# Patient Record
Sex: Female | Born: 2000 | Race: White | Hispanic: No | Marital: Single | Smoking: Never smoker
Health system: Southern US, Community
[De-identification: ages and names within clinical notes are randomized; demographics above are authoritative.]

---
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
## 2022-01-31 ENCOUNTER — Encounter: Payer: Self-pay | Admitting: Oncology

## 2022-01-31 ENCOUNTER — Ambulatory Visit (INDEPENDENT_AMBULATORY_CARE_PROVIDER_SITE_OTHER): Payer: 59 | Admitting: Oncology

## 2022-01-31 VITALS — BP 110/78 | Temp 98.9°F | Resp 18 | Ht 67.0 in | Wt 142.0 lb

## 2022-01-31 DIAGNOSIS — R1013 Epigastric pain: Secondary | ICD-10-CM

## 2022-01-31 MED ORDER — PANTOPRAZOLE SODIUM 20 MG PO TBEC: 20.0000 mg | DELAYED_RELEASE_TABLET | Freq: Every day | ORAL | 0 refills | Status: DC

## 2022-01-31 NOTE — Progress Notes (Signed)
[facility] [address] [place], [place] [address] Phone: [phone] Fax: [phone]   Office Visit Note  Patient Name: Kayla Good  Date of NOBSJ:[id number]  Med Rec number [id number]  Date of Service: 01/31/2022  Patient has no known allergies.  No chief complaint on file.  Here for epigastric abdominal pain. Symptoms are intermittent and started about 2 weeks ago. Occasionally has to stop what she is doing d/t pain. Had gall bladder removed in May for gall stones and RUQ pain.Gall bladder sx have fully resolved.  Having bowel movements daily and sometimes several/day mixed soft and liquid in consistency. Worse in the morning, midday and after eating meals. Diet is unchanged and not different from home.  No known food allergies although she does limit dairy d/t bloating.  Occasional nausea but no vomiting. Has not tried anything otc. Not currently having pain.   Current Medication:  Outpatient Encounter Medications as of 01/31/2022  Medication Sig   pantoprazole (PROTONIX) 20 MG tablet Take 1 tablet (20 mg total) by mouth daily.   NEXTSTELLIS 3-14.2 MG TABS Take 1 tablet by mouth daily.   No facility-administered encounter medications on file as of 01/31/2022.      Medical History: History reviewed. No pertinent past medical history.   Vital Signs: BP 110/78   Temp 98.9 F (37.2 C) (Tympanic)   Resp 18   Ht 5\' 7"  (1.702 m)   Wt 142 lb (64.4 kg)   SpO2 99%   BMI 22.24 kg/m    Review of Systems  Gastrointestinal:  Positive for abdominal pain, diarrhea and nausea.    Physical Exam Constitutional:      Appearance: Normal appearance.  Abdominal:     General: Bowel sounds are increased.     Palpations: Abdomen is soft. There is no hepatomegaly or splenomegaly.     Tenderness: There is abdominal tenderness in the epigastric area and periumbilical area.     No results found for this or any previous visit (from the past 24  hour(s)).  Assessment/Plan: 1. Epigastric abdominal pain- -Unclear etiology of symptoms.  -Reproducible tenderness on exam. -Discussed several different diagnosis including IBS, GERD or food intolerance.  -Discussed trial of an acid reducer such an Protonix 20 mg daily X 2 weeks to see if symptoms improved.  - If no improvement could discuss referral to Gastroenterology given history.   Disposition- Please let me know if your sx worsen. RTC in 2 weeks for follow-up.    General Counseling: Salote verbalizes understanding of the findings of todays visit and agrees with plan of treatment. I have discussed any further diagnostic evaluation that may be needed or ordered today. We also reviewed her medications today. she has been encouraged to call the office with any questions or concerns that should arise related to todays visit.   No orders of the defined types were placed in this encounter.    I spent 25 minutes dedicated to the care of this patient (face-to-face and non-face-to-face) on the date of the encounter to include what is described in the assessment and plan.   , NP 01/31/2022 12:47 PM

## 2022-02-26 ENCOUNTER — Other Ambulatory Visit: Payer: Self-pay | Admitting: Oncology

## 2022-02-26 DIAGNOSIS — R1013 Epigastric pain: Secondary | ICD-10-CM

## 2022-03-13 ENCOUNTER — Other Ambulatory Visit: Payer: Self-pay | Admitting: Oncology

## 2022-03-13 DIAGNOSIS — R1013 Epigastric pain: Secondary | ICD-10-CM

## 2022-04-17 ENCOUNTER — Ambulatory Visit (INDEPENDENT_AMBULATORY_CARE_PROVIDER_SITE_OTHER): Payer: 59 | Admitting: Oncology

## 2022-04-17 ENCOUNTER — Encounter: Payer: Self-pay | Admitting: Oncology

## 2022-04-17 VITALS — BP 118/62 | HR 102 | Temp 97.2°F | Resp 18 | Ht 67.0 in | Wt 142.0 lb

## 2022-04-17 DIAGNOSIS — R5383 Other fatigue: Secondary | ICD-10-CM

## 2022-04-17 DIAGNOSIS — R1084 Generalized abdominal pain: Secondary | ICD-10-CM | POA: Diagnosis not present

## 2022-04-17 DIAGNOSIS — R110 Nausea: Secondary | ICD-10-CM | POA: Diagnosis not present

## 2022-04-17 MED ORDER — ONDANSETRON HCL 4 MG PO TABS: 4.0000 mg | ORAL_TABLET | Freq: Three times a day (TID) | ORAL | 0 refills | Status: DC | PRN

## 2022-04-17 NOTE — Progress Notes (Signed)
[facility] [address] [place], [place] [address] Phone: [phone] Fax: [phone]   Office Visit Note  Patient Name: Kayla Good  Date of ACZYS:[id number]  Med Rec number [id number]  Date of Service: 04/17/2022  Patient has no known allergies.  Chief Complaint  Patient presents with   Fatigue   Patient is an 21 y.o. student here for complaints of fatigue,nausea, sleeping alot and low appetite X 1 month. Has never had similar sx in the past. Occasional abdominal pain. No hx of acid reflux in the past.   Has heavy menstural cycles. BC has improved her symptoms some. Cycles last about 4-5 days.   Has never had mono. Knows a friend that was recently diagnosed.   Takes protonix when she needs it. Not daily. Has a dairy/lactose allergy.   Current Medication:  Outpatient Encounter Medications as of 04/17/2022  Medication Sig   NEXTSTELLIS 3-14.2 MG TABS Take 1 tablet by mouth daily.   pantoprazole (PROTONIX) 20 MG tablet TAKE 1 TABLET BY MOUTH EVERY DAY   No facility-administered encounter medications on file as of 04/17/2022.      Medical History: History reviewed. No pertinent past medical history.  Vital Signs: BP 118/62   Pulse (!) 102   Temp (!) 97.2 F (36.2 C) (Tympanic)   Resp 18   Ht 5\' 7"  (1.702 m)   Wt 142 lb (64.4 kg)   SpO2 99%   BMI 22.24 kg/m   ROS: As per HPI.  All other pertinent ROS negative.     Review of Systems  Constitutional:  Positive for appetite change and fatigue.  Gastrointestinal:  Positive for abdominal pain, diarrhea and nausea.  Musculoskeletal:  Positive for back pain (Left upper back pain occasional). Negative for myalgias.  Neurological:  Positive for headaches.    Physical Exam Constitutional:      Appearance: Normal appearance.  HENT:     Right Ear: Tympanic membrane normal.     Left Ear: Tympanic membrane normal.     Nose: No congestion or rhinorrhea.     Mouth/Throat:     Pharynx: No pharyngeal  swelling, oropharyngeal exudate or posterior oropharyngeal erythema.  Cardiovascular:     Rate and Rhythm: Normal rate.  Abdominal:     General: Bowel sounds are normal.     Palpations: Abdomen is soft. There is no hepatomegaly or splenomegaly.     Tenderness: There is no abdominal tenderness.     Comments: No reproducible abdominal pain.   Lymphadenopathy:     Cervical: No cervical adenopathy.  Neurological:     Mental Status: She is alert.     No results found for this or any previous visit (from the past 24 hour(s)).  Assessment/Plan: 1. Other fatigue -Labs today to rule out abnormalities such as anemia, thyroid deficiency, mono or vitamin D deficiency.  Discussed that results will be sent via MyChart when they return.  - CBC w/Diff - TSH - Vitamin D (25 hydroxy) - B12 - Ferritin - EBV ab to viral capsid ag pnl, IgG+IgM  2. Nausea -Can try Zofran 4 mg tablets every 6-8 hours as needed for nausea.  Recommend taking 15 minutes prior to eating food.  Provided prescription while in clinic today.  Discussed consistently taking Protonix 20 mg each day for GERD symptoms.  Protonix is not typically used for episodes of acid reflux rather management.  - ondansetron (ZOFRAN) 4 MG tablet; Take 1 tablet (4 mg total) by mouth every 8 (eight) hours  as needed for nausea or vomiting.  Dispense: 5 tablet; Refill: 0  3. Generalized abdominal pain -No reproducible pain.  Described as generalized.  Given close encounter with a friend who tested positive for mono would recommend EBV labs today to rule out mono.  Also having significant fatigue.  - EBV ab to viral capsid ag pnl, IgG+IgM   Disposition-RTC for worsening symptoms.  We will send results when available.  General Counseling: Kayla Good verbalizes understanding of the findings of todays visit and agrees with plan of treatment. I have discussed any further diagnostic evaluation that may be needed or ordered today. We also reviewed her  medications today. she has been encouraged to call the office with any questions or concerns that should arise related to todays visit.   No orders of the defined types were placed in this encounter.   No orders of the defined types were placed in this encounter.  I spent 20 minutes dedicated to the care of this patient (face-to-face and non-face-to-face) on the date of the encounter to include what is described in the assessment and plan.   Durenda Hurt, NP 04/17/2022 10:29 AM

## 2022-04-18 LAB — CBC WITH DIFFERENTIAL/PLATELET
Basophils Absolute: 0.1 10*3/uL (ref 0.0–0.2)
Basos: 1 %
EOS (ABSOLUTE): 0.2 10*3/uL (ref 0.0–0.4)
Eos: 2 %
Hematocrit: 38.1 % (ref 34.0–46.6)
Hemoglobin: 12.5 g/dL (ref 11.1–15.9)
Immature Grans (Abs): 0 10*3/uL (ref 0.0–0.1)
Immature Granulocytes: 0 %
Lymphocytes Absolute: 2.2 10*3/uL (ref 0.7–3.1)
Lymphs: 30 %
MCH: 28 pg (ref 26.6–33.0)
MCHC: 32.8 g/dL (ref 31.5–35.7)
MCV: 85 fL (ref 79–97)
Monocytes Absolute: 0.6 10*3/uL (ref 0.1–0.9)
Monocytes: 8 %
Neutrophils Absolute: 4.3 10*3/uL (ref 1.4–7.0)
Neutrophils: 59 %
Platelets: 300 10*3/uL (ref 150–450)
RBC: 4.47 x10E6/uL (ref 3.77–5.28)
RDW: 12.2 % (ref 11.7–15.4)
WBC: 7.3 10*3/uL (ref 3.4–10.8)

## 2022-04-18 LAB — EBV AB TO VIRAL CAPSID AG PNL, IGG+IGM
EBV VCA IgG: >600 U/mL — ABNORMAL HIGH (ref 0.0–17.9)
EBV VCA IgM: <36 U/mL (ref 0.0–35.9)

## 2022-04-18 LAB — FERRITIN: Ferritin: 14 ng/mL — ABNORMAL LOW (ref 15–77)

## 2022-04-18 LAB — VITAMIN B12: Vitamin B-12: 504 pg/mL (ref 232–1245)

## 2022-04-18 LAB — VITAMIN D 25 HYDROXY (VIT D DEFICIENCY, FRACTURES): Vit D, 25-Hydroxy: 35.5 ng/mL (ref 30.0–100.0)

## 2022-04-18 LAB — TSH: TSH: 1.73 u[IU]/mL (ref 0.450–4.500)

## 2022-04-19 ENCOUNTER — Encounter: Payer: Self-pay | Admitting: Family Medicine

## 2022-08-14 ENCOUNTER — Ambulatory Visit: Payer: 59 | Admitting: Oncology

## 2023-03-04 ENCOUNTER — Encounter: Payer: Self-pay | Admitting: Oncology

## 2023-03-04 ENCOUNTER — Ambulatory Visit (INDEPENDENT_AMBULATORY_CARE_PROVIDER_SITE_OTHER): Payer: 59 | Admitting: Oncology

## 2023-03-04 VITALS — BP 106/62 | HR 78 | Temp 98.8°F | Ht 66.0 in | Wt 148.0 lb

## 2023-03-04 DIAGNOSIS — G4700 Insomnia, unspecified: Secondary | ICD-10-CM | POA: Diagnosis not present

## 2023-03-04 DIAGNOSIS — R5383 Other fatigue: Secondary | ICD-10-CM

## 2023-03-04 MED ORDER — HYDROXYZINE HCL 10 MG PO TABS
10.0000 mg | ORAL_TABLET | Freq: Three times a day (TID) | ORAL | 0 refills | Status: DC | PRN
Start: 2023-03-04 — End: 2024-02-04

## 2023-03-04 NOTE — Progress Notes (Signed)
[facility] [address] [place], [place] [address] Phone: [phone] Fax: [phone]   Office Visit Note  Patient Name: Kayla Good  Date of ZHYQM:[id number]  Med Rec number [id number]  Date of Service: 03/04/2023  Patient has no known allergies.  Chief Complaint  Patient presents with   Acute Visit   HPI Patient is an 22 y.o. student here for complaints of fatigue, trouble sleeping (waking every hour), HA and nausea over the past few weeks.  Reports the first week of classes she was able to sleep fine but ever since she has had issues.  Feels like her appetite is also low. Has tried melatonin, Night cap gummies which have helped her fall asleep but not stay asleep.  Denies feeling overly anxious.  Reports classes are busy but not overwhelming.  Feels like she exerts enough energy throughout the day.  Does not drink excessive amounts of caffeine.  She is not taking any medications that are stimulating.  Current Medication:  Outpatient Encounter Medications as of 03/04/2023  Medication Sig   NEXTSTELLIS 3-14.2 MG TABS Take 1 tablet by mouth daily.   ondansetron (ZOFRAN) 4 MG tablet Take 1 tablet (4 mg total) by mouth every 8 (eight) hours as needed for nausea or vomiting. (Patient not taking: Reported on 03/04/2023)   pantoprazole (PROTONIX) 20 MG tablet TAKE 1 TABLET BY MOUTH EVERY DAY (Patient not taking: Reported on 03/04/2023)   No facility-administered encounter medications on file as of 03/04/2023.   Medical History: History reviewed. No pertinent past medical history.  Vital Signs: BP 106/62   Pulse 78   Temp 98.8 F (37.1 C) (Tympanic)   Ht 5\' 6"  (1.676 m)   Wt 148 lb (67.1 kg)   SpO2 99%   BMI 23.89 kg/m   ROS: As per HPI.  All other pertinent ROS negative.     Review of Systems  Constitutional:  Positive for appetite change and fatigue.  Gastrointestinal:  Positive for nausea.  Psychiatric/Behavioral:  Positive for sleep disturbance.      Physical Exam Vitals reviewed.  Constitutional:      General: She is not in acute distress.    Appearance: Normal appearance.  Neurological:     Mental Status: She is alert.     No results found for this or any previous visit (from the past 24 hour(s)).  Assessment/Plan: 1. Insomnia, unspecified type Has tried melatonin and other over-the-counter sleep aids without significant improvement. Denies any over-the-counter or prescribed stimulating agents. Denies excessive caffeine use. We discussed trying hydroxyzine 10 mg tablets.  Take 1 by mouth at night about 30-45 minutes before bed.  May also take her melatonin 10 mg.  We discussed slowly increasing over the next few nights to see if this helps her insomnia. Plan is for her to reach out based on how she tolerates.  - hydrOXYzine (ATARAX) 10 MG tablet; Take 1 tablet (10 mg total) by mouth 3 (three) times daily as needed.  Dispense: 60 tablet; Refill: 0  2. Fatigue, unspecified type Recommend lab work to rule out blood abnormality.  Previously she was told she had low iron levels.  Will recheck plus others.  Discussed we will send results via MyChart when available.  - CBC w/Diff - Comp Met (CMET) - TSH - Vitamin D (25 hydroxy) - B12 - Ferritin - Iron, TIBC and Ferritin Panel   General Counseling: [name] verbalizes understanding of the findings of todays visit and agrees with plan of treatment. I have  discussed any further diagnostic evaluation that may be needed or ordered today. We also reviewed her medications today. she has been encouraged to call the office with any questions or concerns that should arise related to todays visit.   No orders of the defined types were placed in this encounter.   No orders of the defined types were placed in this encounter.   I spent 20 minutes dedicated to the care of this patient (face-to-face and non-face-to-face) on the date of the encounter to include what is described in the  assessment and plan.   Durenda Hurt, NP 03/04/2023 4:47 PM

## 2023-03-06 ENCOUNTER — Encounter: Payer: Self-pay | Admitting: Oncology

## 2023-03-06 LAB — CBC WITH DIFFERENTIAL/PLATELET
Basophils Absolute: 0.1 10*3/uL (ref 0.0–0.2)
Basos: 1 %
EOS (ABSOLUTE): 0.1 10*3/uL (ref 0.0–0.4)
Eos: 2 %
Hematocrit: 40.9 % (ref 34.0–46.6)
Hemoglobin: 13.1 g/dL (ref 11.1–15.9)
Immature Grans (Abs): 0 10*3/uL (ref 0.0–0.1)
Immature Granulocytes: 0 %
Lymphocytes Absolute: 2.2 10*3/uL (ref 0.7–3.1)
Lymphs: 27 %
MCH: 28.1 pg (ref 26.6–33.0)
MCHC: 32 g/dL (ref 31.5–35.7)
MCV: 88 fL (ref 79–97)
Monocytes Absolute: 0.7 10*3/uL (ref 0.1–0.9)
Monocytes: 8 %
Neutrophils Absolute: 5 10*3/uL (ref 1.4–7.0)
Neutrophils: 62 %
Platelets: 309 10*3/uL (ref 150–450)
RBC: 4.67 x10E6/uL (ref 3.77–5.28)
RDW: 12.1 % (ref 11.7–15.4)
WBC: 8.1 10*3/uL (ref 3.4–10.8)

## 2023-03-06 LAB — COMPREHENSIVE METABOLIC PANEL
ALT: 16 [IU]/L (ref 0–32)
AST: 20 [IU]/L (ref 0–40)
Albumin: 4.8 g/dL (ref 4.0–5.0)
Alkaline Phosphatase: 121 [IU]/L — ABNORMAL HIGH (ref 42–106)
BUN/Creatinine Ratio: 14 (ref 9–23)
BUN: 10 mg/dL (ref 6–20)
Bilirubin Total: 0.4 mg/dL (ref 0.0–1.2)
CO2: 20 mmol/L (ref 20–29)
Calcium: 9.7 mg/dL (ref 8.7–10.2)
Chloride: 103 mmol/L (ref 96–106)
Creatinine, Ser: 0.69 mg/dL (ref 0.57–1.00)
Globulin, Total: 2.7 g/dL (ref 1.5–4.5)
Glucose: 91 mg/dL (ref 70–99)
Potassium: 4.7 mmol/L (ref 3.5–5.2)
Sodium: 139 mmol/L (ref 134–144)
Total Protein: 7.5 g/dL (ref 6.0–8.5)
eGFR: 127 mL/min/{1.73_m2} (ref >59)

## 2023-03-06 LAB — TSH: TSH: 1.45 u[IU]/mL (ref 0.450–4.500)

## 2023-03-06 LAB — FERRITIN: Ferritin: 21 ng/mL (ref 15–150)

## 2023-03-06 LAB — VITAMIN D 25 HYDROXY (VIT D DEFICIENCY, FRACTURES): Vit D, 25-Hydroxy: 54 ng/mL (ref 30.0–100.0)

## 2023-03-06 LAB — VITAMIN B12: Vitamin B-12: 468 pg/mL (ref 232–1245)

## 2023-08-02 IMAGING — US US ABDOMEN LIMITED
1 series · 14 of 25 positions shown · non-contrast
Comparison: None.

CLINICAL DATA: Right upper quadrant pain.  History of gallstones.

EXAM:
ULTRASOUND ABDOMEN LIMITED RIGHT UPPER QUADRANT

[Series 1: us abdomen limited ruq (liver/gb) · 14 of 33 slices shown]
[im 1/33]
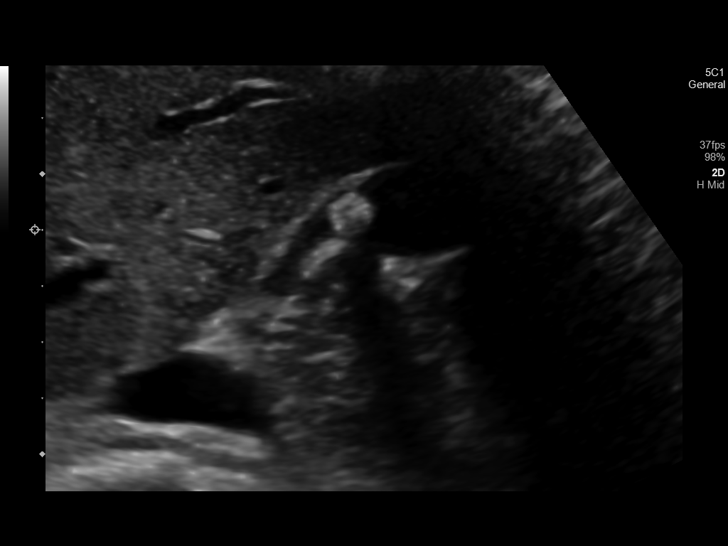
[im 3/33]
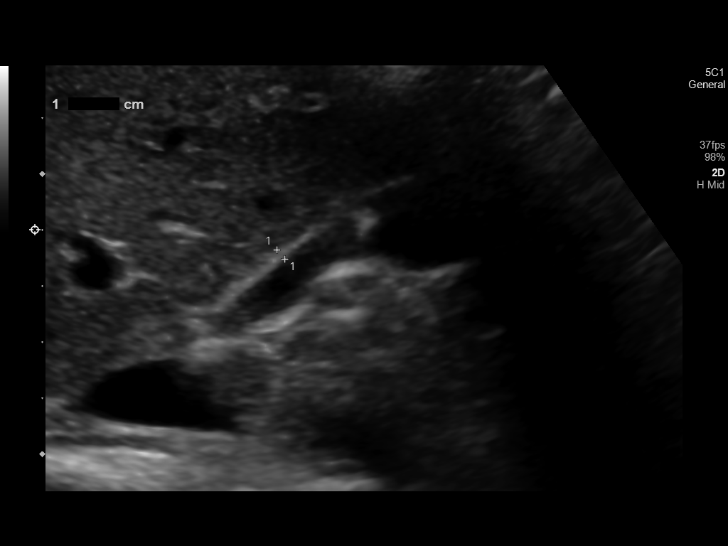
[im 6/33]
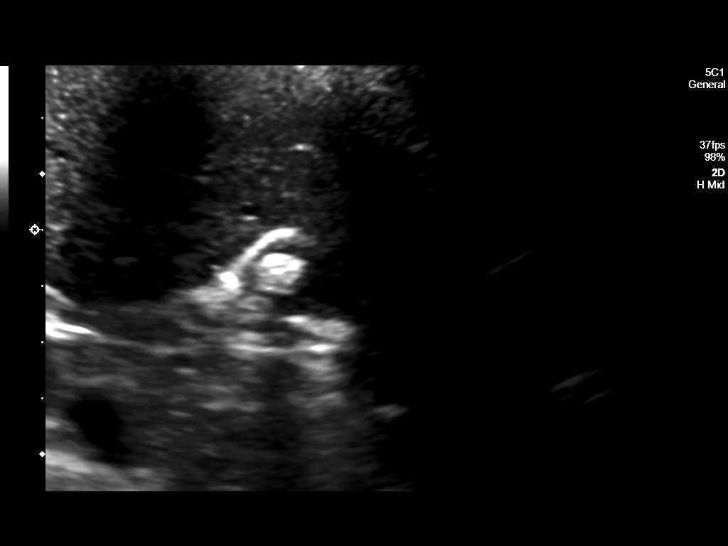
[im 9/33]
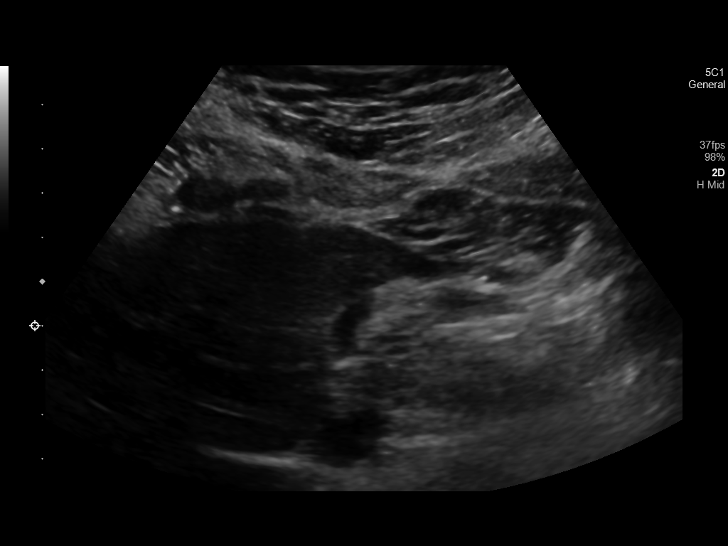
[im 11/33]
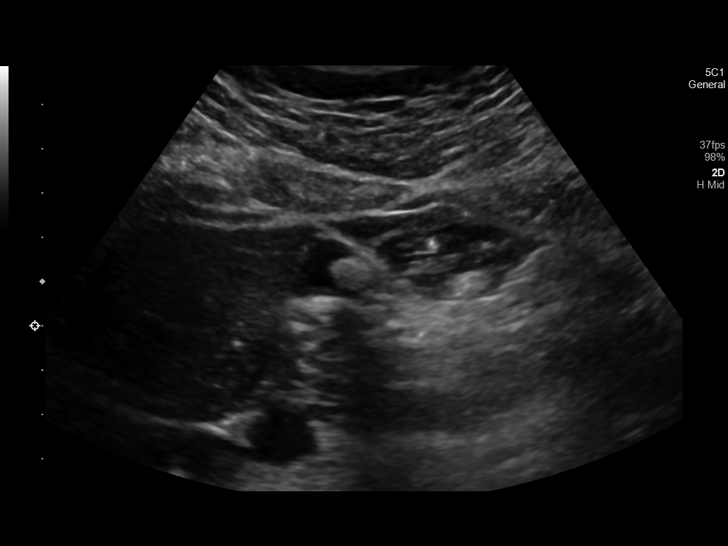
[im 13/33]
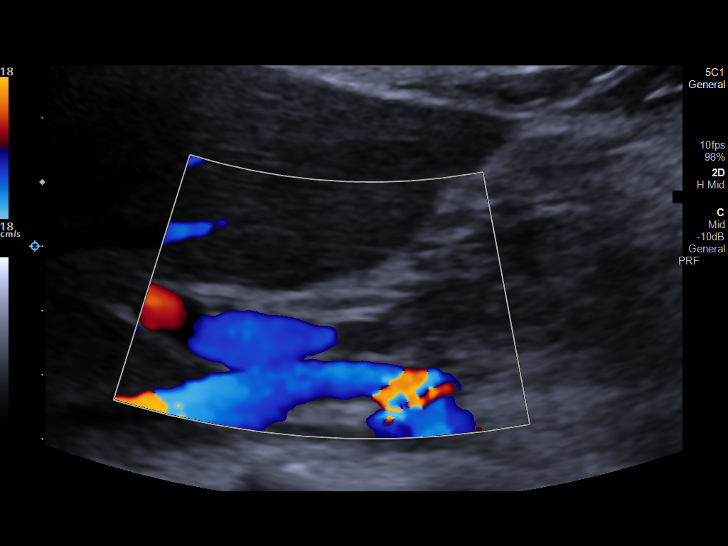
[im 15/33]
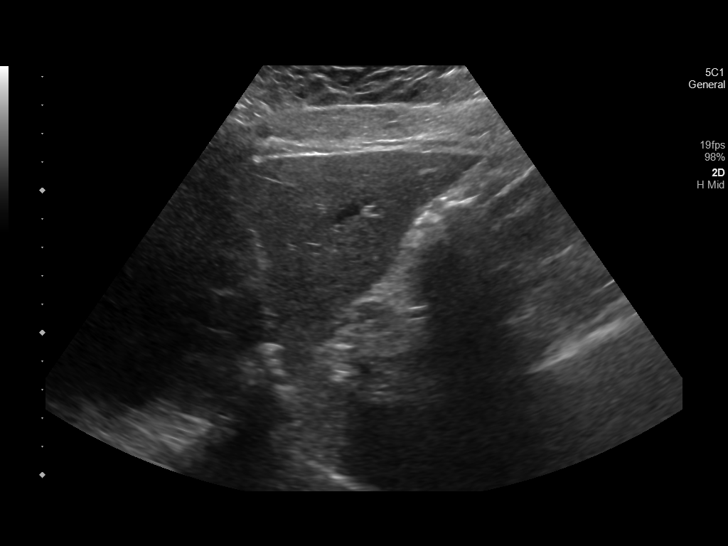
[im 18/33]
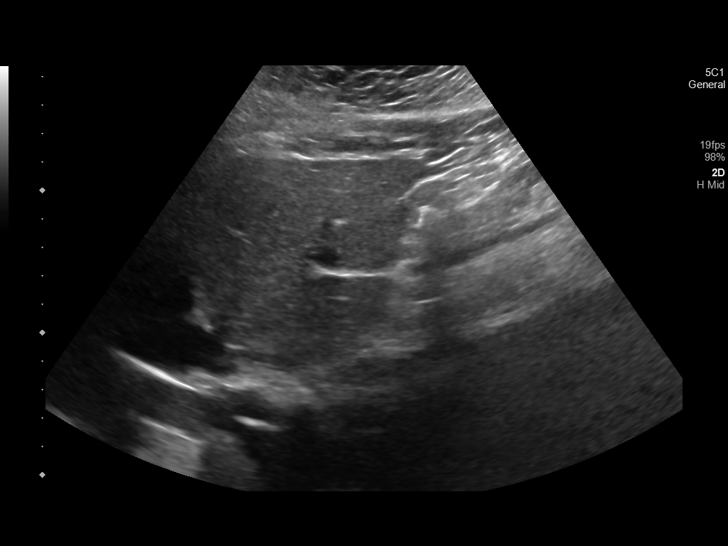
[im 21/33]
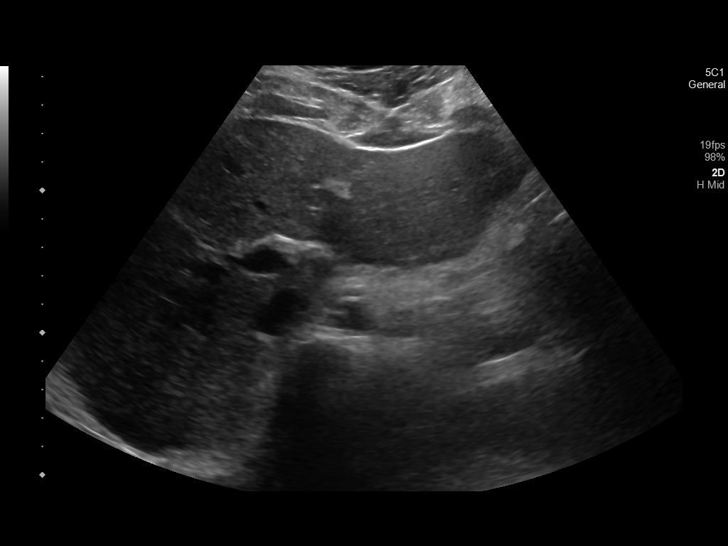
[im 22/33]
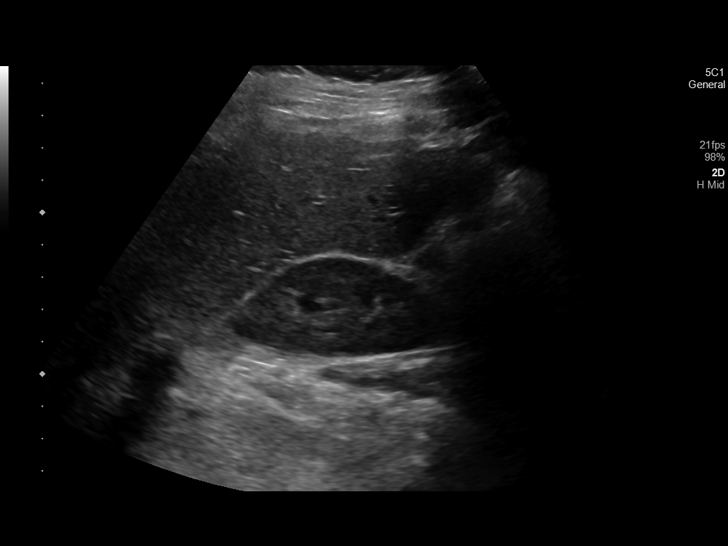
[im 25/33]
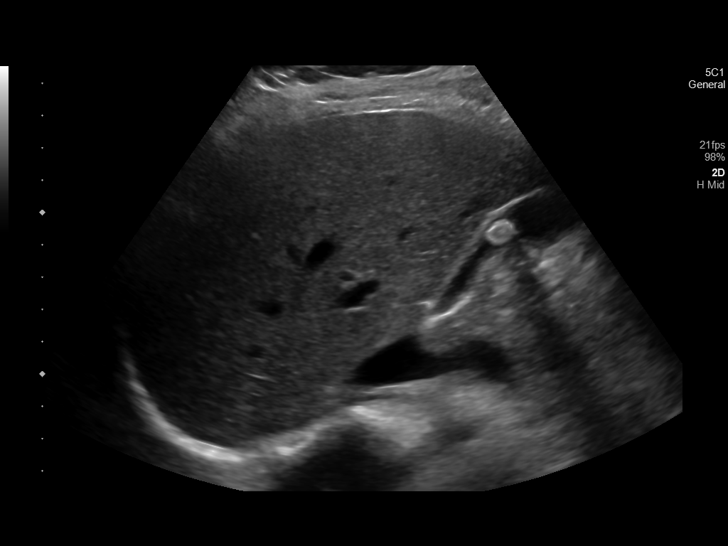
[im 27/33]
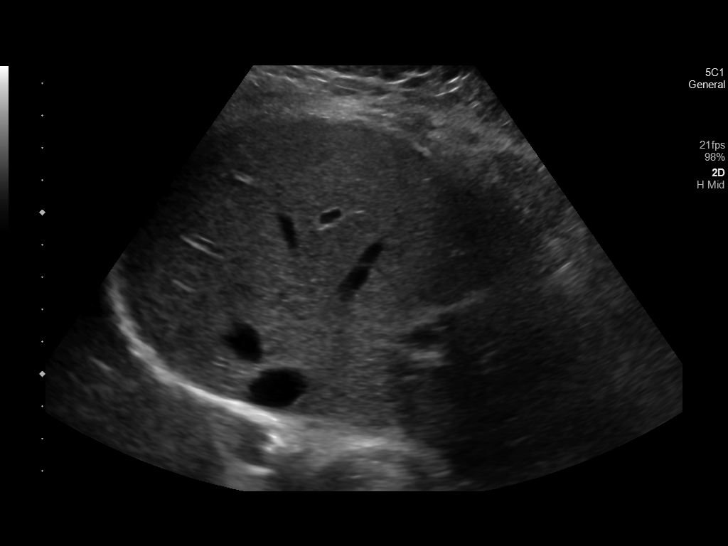
[im 30/33]
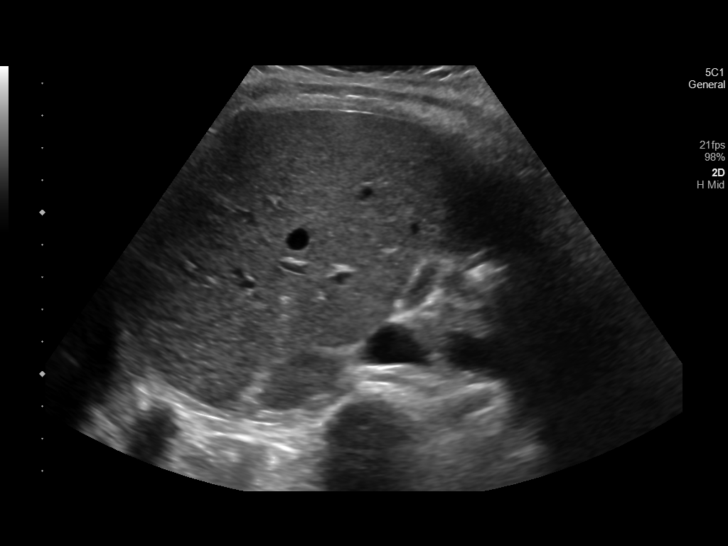
[im 33/33]
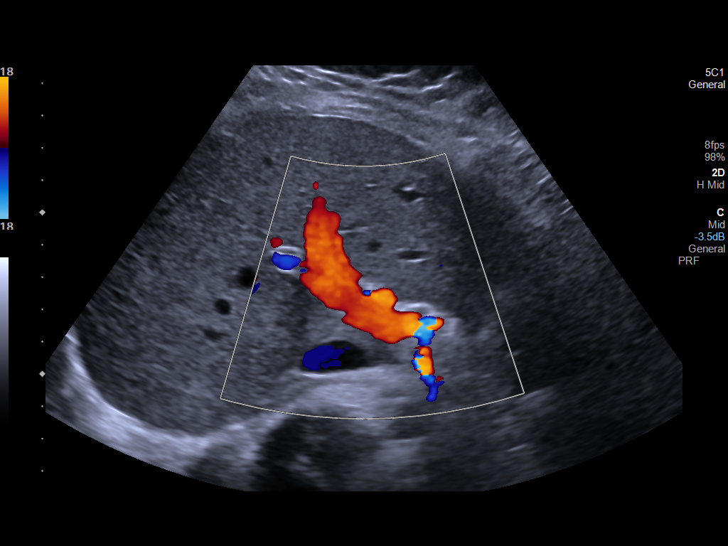

[14 of 25 positions shown; findings below may reference images not displayed]

FINDINGS: Gallbladder:

Multiple shadowing echogenic gallstones are seen. The largest
measures 9 mm. Normal gallbladder wall thickness of 2 mm.

Common bile duct:

Diameter: 2 mm

Liver:

No focal lesion identified. Within normal limits in parenchymal
echogenicity. Portal vein is patent on color Doppler imaging with
normal direction of blood flow towards the liver.

Other: None.
IMPRESSION: Cholelithiasis without sonographic evidence of acute cholecystitis.

## 2023-08-03 ENCOUNTER — Other Ambulatory Visit: Payer: Self-pay

## 2023-08-03 ENCOUNTER — Ambulatory Visit
Admission: EM | Admit: 2023-08-03 | Discharge: 2023-08-03 | Disposition: A | Discharge status: Home / Self Care | Attending: Emergency Medicine | Admitting: Emergency Medicine

## 2023-08-03 ENCOUNTER — Encounter: Payer: Self-pay | Admitting: Emergency Medicine

## 2023-08-03 DIAGNOSIS — B349 Viral infection, unspecified: Secondary | ICD-10-CM

## 2023-08-03 LAB — POC COVID19/FLU A&B COMBO
Covid Antigen, POC: NEGATIVE
Influenza A Antigen, POC: NEGATIVE
Influenza B Antigen, POC: NEGATIVE

## 2023-08-03 LAB — POCT MONO SCREEN (KUC): Mono, POC: NEGATIVE

## 2023-08-03 MED ORDER — PREDNISONE 10 MG (21) PO TBPK: ORAL_TABLET | Freq: Every day | ORAL | 0 refills | Status: DC

## 2023-08-03 MED ORDER — ONDANSETRON 4 MG PO TBDP: 4.0000 mg | ORAL_TABLET | Freq: Three times a day (TID) | ORAL | 0 refills | Status: DC | PRN

## 2023-08-03 NOTE — Discharge Instructions (Addendum)
 Your symptoms today are most likely being caused by a virus and should steadily improve in time it can take up to 7 to 10 days before you truly start to see a turnaround however things will get better  COVID, flu and monotest negative  Lymph nodes are swollen most likely due to nasal congestion and drainage as it attempts to help clear this out from the body, causes pain as it presses on the tissues around it  To help reduce lymph nodes and for general comfort begin prednisone every morning as directed, take with food, may use Tylenol in addition but avoid use of ibuprofen  May use Zofran every 8 hours as needed for nausea    You can take Tylenol and/or Ibuprofen as needed for fever reduction and pain relief.   For cough: honey 1/2 to 1 teaspoon (you can dilute the honey in water or another fluid).  You can also use guaifenesin and dextromethorphan for cough. You can use a humidifier for chest congestion and cough.  If you don't have a humidifier, you can sit in the bathroom with the hot shower running.      For sore throat: try warm salt water gargles, cepacol lozenges, throat spray, warm tea or water with lemon/honey, popsicles or ice, or OTC cold relief medicine for throat discomfort.   For congestion: take a daily anti-histamine like Zyrtec, Claritin, and a oral decongestant, such as pseudoephedrine.  You can also use Flonase 1-2 sprays in each nostril daily.   It is important to stay hydrated: drink plenty of fluids (water, gatorade/powerade/pedialyte, juices, or teas) to keep your throat moisturized and help further relieve irritation/discomfort.

## 2023-08-03 NOTE — ED Triage Notes (Signed)
 Patient presents to [facility] for evaluation of left lymph node swelling and tenderness x 2 days.  Patient states on and off nsal congestion recently.  Denies ear pain, denies sore throat, denies hx of same.

## 2023-08-03 NOTE — ED Provider Notes (Signed)
 [facility]    CSN: [id number] Arrival date & time: 08/03/23  1350      History   Chief Complaint Chief Complaint  Patient presents with   Lymphadenopathy    HPI Kayla Good is a 23 y.o. female.   Patient presents for evaluation of nasal congestion, postnasal drip, left-sided ear fullness, intermittent headaches, mild severe pain present for 2 days.  Has experienced nausea without vomiting over the past 7 days.  Denies abdominal pain.  No known sick contacts.  Tolerating food and liquids.  Has not attempted treatment.  Denies fever.  History reviewed. No pertinent past medical history.  There are no active problems to display for this patient.   History reviewed. No pertinent surgical history.  OB History   No obstetric history on file.      Home Medications    Prior to Admission medications   Medication Sig Start Date End Date Taking? Authorizing Provider  ondansetron (ZOFRAN-ODT) 4 MG disintegrating tablet Take 1 tablet (4 mg total) by mouth every 8 (eight) hours as needed. 08/03/23  Yes Cleone Hulick R, NP  predniSONE (STERAPRED UNI-PAK 21 TAB) 10 MG (21) TBPK tablet Take by mouth daily. Take 6 tabs by mouth daily  for 1 days, then 5 tabs for 1 days, then 4 tabs for 1 days, then 3 tabs for 1 days, 2 tabs for 1 days, then 1 tab by mouth daily for 1 days 08/03/23  Yes Keydi Giel R, NP  hydrOXYzine (ATARAX) 10 MG tablet Take 1 tablet (10 mg total) by mouth 3 (three) times daily as needed. 03/04/23   Mauro Kaufmann, NP  NEXTSTELLIS 3-14.2 MG TABS Take 1 tablet by mouth daily. 12/28/21   [provider]  ondansetron (ZOFRAN) 4 MG tablet Take 1 tablet (4 mg total) by mouth every 8 (eight) hours as needed for nausea or vomiting. Patient not taking: Reported on 03/04/2023 04/17/22   Mauro Kaufmann, NP  pantoprazole (PROTONIX) 20 MG tablet TAKE 1 TABLET BY MOUTH EVERY DAY Patient not taking: Reported on 03/04/2023 03/13/22   Mauro Kaufmann, NP     Family History History reviewed. No pertinent family history.  Social History Social History   Tobacco Use   Smoking status: Never   Smokeless tobacco: Never  Vaping Use   Vaping status: Never Used  Substance Use Topics   Alcohol use: Yes   Drug use: Never     Allergies   Patient has no known allergies.   Review of Systems Review of Systems   Physical Exam Triage Vital Signs ED Triage Vitals [08/03/23 1411]  Encounter Vitals Group     BP 119/83     Systolic BP Percentile      Diastolic BP Percentile      Pulse Rate 87     Resp 16     Temp (!) 97.3 F (36.3 C)     Temp Source Temporal     SpO2 97 %     Weight      Height      Head Circumference      Peak Flow      Pain Score 2     Pain Loc      Pain Education      Exclude from Growth Chart    No data found.  Updated Vital Signs BP 119/83 (BP Location: Left Arm)   Pulse 87   Temp (!) 97.3 F (36.3 C) (Temporal)   Resp 16  LMP 07/16/2023 (Approximate)   SpO2 97%   Visual Acuity Right Eye Distance:   Left Eye Distance:   Bilateral Distance:    Right Eye Near:   Left Eye Near:    Bilateral Near:     Physical Exam Constitutional:      Appearance: Normal appearance.  HENT:     Right Ear: Tympanic membrane, ear canal and external ear normal.     Left Ear: Tympanic membrane, ear canal and external ear normal.     Nose: Congestion present. No rhinorrhea.     Mouth/Throat:     Pharynx: No oropharyngeal exudate.  Eyes:     Extraocular Movements: Extraocular movements intact.  Cardiovascular:     Pulses: Normal pulses.     Heart sounds: Normal heart sounds.  Pulmonary:     Effort: Pulmonary effort is normal.     Breath sounds: Normal breath sounds.  Lymphadenopathy:     Cervical: Cervical adenopathy present.  Neurological:     Mental Status: She is alert and oriented to person, place, and time. Mental status is at baseline.      UC Treatments / Results  Labs (all labs ordered are  listed, but only abnormal results are displayed) Labs Reviewed  POC COVID19/FLU A&B COMBO - Normal  POCT MONO SCREEN ([facility]) - Normal    EKG   Radiology No results found.  Procedures Procedures (including critical care time)  Medications Ordered in UC Medications - No data to display  Initial Impression / Assessment and Plan / UC Course  I have reviewed the triage vital signs and the nursing notes.  Pertinent labs & imaging results that were available during my care of the patient were reviewed by me and considered in my medical decision making (see chart for details).  Viral illness  Patient is in no signs of distress nor toxic appearing.  Vital signs are stable.  Low suspicion for pneumonia, pneumothorax or bronchitis and therefore will defer imaging.  COVID, flu and mono test negative.  Prescribed prednisone and Zofran.May use additional over-the-counter medications as needed for supportive care.  May follow-up with urgent care as needed if symptoms persist or worsen.  Final Clinical Impressions(s) / UC Diagnoses   Final diagnoses:  Viral illness     Discharge Instructions      Your symptoms today are most likely being caused by a virus and should steadily improve in time it can take up to 7 to 10 days before you truly start to see a turnaround however things will get better  COVID, flu and monotest negative  Lymph nodes are swollen most likely due to nasal congestion and drainage as it attempts to help clear this out from the body, causes pain as it presses on the tissues around it  To help reduce lymph nodes and for general comfort begin prednisone every morning as directed, take with food, may use Tylenol in addition but avoid use of ibuprofen  May use Zofran every 8 hours as needed for nausea    You can take Tylenol and/or Ibuprofen as needed for fever reduction and pain relief.   For cough: honey 1/2 to 1 teaspoon (you can dilute the honey in water or another  fluid).  You can also use guaifenesin and dextromethorphan for cough. You can use a humidifier for chest congestion and cough.  If you don't have a humidifier, you can sit in the bathroom with the hot shower running.      For sore throat:  try warm salt water gargles, cepacol lozenges, throat spray, warm tea or water with lemon/honey, popsicles or ice, or OTC cold relief medicine for throat discomfort.   For congestion: take a daily anti-histamine like Zyrtec, Claritin, and a oral decongestant, such as pseudoephedrine.  You can also use Flonase 1-2 sprays in each nostril daily.   It is important to stay hydrated: drink plenty of fluids (water, gatorade/powerade/pedialyte, juices, or teas) to keep your throat moisturized and help further relieve irritation/discomfort.    ED Prescriptions     Medication Sig Dispense Auth. Provider   predniSONE (STERAPRED UNI-PAK 21 TAB) 10 MG (21) TBPK tablet Take by mouth daily. Take 6 tabs by mouth daily  for 1 days, then 5 tabs for 1 days, then 4 tabs for 1 days, then 3 tabs for 1 days, 2 tabs for 1 days, then 1 tab by mouth daily for 1 days 21 tablet Sarahelizabeth Conway R, NP   ondansetron (ZOFRAN-ODT) 4 MG disintegrating tablet Take 1 tablet (4 mg total) by mouth every 8 (eight) hours as needed. 20 tablet Valinda Hoar, NP      PDMP not reviewed this encounter.   Valinda Hoar, NP 08/03/23 1510

## 2023-08-08 ENCOUNTER — Encounter: Payer: Self-pay | Admitting: Physician Assistant

## 2023-08-08 ENCOUNTER — Ambulatory Visit (INDEPENDENT_AMBULATORY_CARE_PROVIDER_SITE_OTHER): Payer: Self-pay | Admitting: Physician Assistant

## 2023-08-08 VITALS — BP 122/80 | HR 92 | Temp 98.4°F | Ht 66.0 in | Wt 150.0 lb

## 2023-08-08 DIAGNOSIS — R52 Pain, unspecified: Secondary | ICD-10-CM | POA: Diagnosis not present

## 2023-08-08 DIAGNOSIS — R5383 Other fatigue: Secondary | ICD-10-CM

## 2023-08-08 NOTE — Progress Notes (Signed)
 [facility] [address] [place], [place] [address] Phone: [phone] Fax: [phone]   Office Visit Note  Patient Name: Kayla Good  Date of [id number]  Med Rec number [id number]  Date of Service: 08/08/2023  Patient has no known allergies.  Chief Complaint  Patient presents with   Acute Visit     HPI  The patient presents with swollen lymph nodes since last Saturday. She visited urgent care on Sunday, where she was tested for flu, COVID, and mono. She was prescribed prednisone, which she completed, but reported feeling as though she had run a marathon, experiencing whole-body achiness and extreme fatigue. The patient also took naps yesterday due to tiredness. She had mono last year and has not experienced any fever or other associated symptoms.   Current Medication:  Outpatient Encounter Medications as of 08/08/2023  Medication Sig   NEXTSTELLIS 3-14.2 MG TABS Take 1 tablet by mouth daily.   hydrOXYzine (ATARAX) 10 MG tablet Take 1 tablet (10 mg total) by mouth 3 (three) times daily as needed. (Patient not taking: Reported on 08/08/2023)   ondansetron (ZOFRAN) 4 MG tablet Take 1 tablet (4 mg total) by mouth every 8 (eight) hours as needed for nausea or vomiting. (Patient not taking: Reported on 08/08/2023)   ondansetron (ZOFRAN-ODT) 4 MG disintegrating tablet Take 1 tablet (4 mg total) by mouth every 8 (eight) hours as needed. (Patient not taking: Reported on 08/08/2023)   pantoprazole (PROTONIX) 20 MG tablet TAKE 1 TABLET BY MOUTH EVERY DAY (Patient not taking: Reported on 08/08/2023)   predniSONE (STERAPRED UNI-PAK 21 TAB) 10 MG (21) TBPK tablet Take by mouth daily. Take 6 tabs by mouth daily  for 1 days, then 5 tabs for 1 days, then 4 tabs for 1 days, then 3 tabs for 1 days, 2 tabs for 1 days, then 1 tab by mouth daily for 1 days (Patient not taking: Reported on 08/08/2023)   No facility-administered encounter medications on file as of 08/08/2023.      Medical  History: History reviewed. No pertinent past medical history.   Vital Signs: BP 122/80   Pulse 92   Temp 98.4 F (36.9 C) (Tympanic)   Ht 5\' 6"  (1.676 m)   Wt 150 lb (68 kg)   LMP 07/16/2023 (Approximate)   SpO2 99%   BMI 24.21 kg/m    Review of Systems  Physical Exam Constitutional:      Appearance: Normal appearance.  HENT:     Head: Normocephalic and atraumatic.     Right Ear: Tympanic membrane normal.     Left Ear: Tympanic membrane normal.     Mouth/Throat:     Mouth: Mucous membranes are moist.     Pharynx: Oropharynx is clear.  Cardiovascular:     Rate and Rhythm: Normal rate and regular rhythm.     Pulses: Normal pulses.     Heart sounds: Normal heart sounds.  Musculoskeletal:     Cervical back: Normal range of motion and neck supple.  Neurological:     Mental Status: She is alert.       Assessment/Plan: The patient presents with swollen lymph nodes since Saturday, along with fatigue, tiredness, and body aches. POC testing at urgent care was negative for flu, COVID, and mono, and she has a history of mono. Today's exam is reassuring, with no significant lymphadenopathy or abnormalities noted. We will proceed with blood work, including CBC, BMP, EBV panel, and ferritin. The main symptoms today are fatigue and body  aches. We will follow up once lab results are available. Precautions have been given, and the patient is advised to follow up if symptoms persist or worsen.  General Counseling: Alzina verbalizes understanding of the findings of todays visit and agrees with plan of treatment. I have discussed any further diagnostic evaluation that may be needed or ordered today. We also reviewed her medications today. she has been encouraged to call the office with any questions or concerns that should arise related to todays visit.   No orders of the defined types were placed in this encounter.   No orders of the defined types were placed in this  encounter.   Time spent:20 Minutes Time spent includes review of chart, medications, test results, and follow up plan with the patient.    Leroy Kennedy, PA-C [facility]

## 2023-08-10 LAB — BASIC METABOLIC PANEL
BUN/Creatinine Ratio: 12 (ref 9–23)
BUN: 11 mg/dL (ref 6–20)
CO2: 19 mmol/L — ABNORMAL LOW (ref 20–29)
Calcium: 9.9 mg/dL (ref 8.7–10.2)
Chloride: 105 mmol/L (ref 96–106)
Creatinine, Ser: 0.89 mg/dL (ref 0.57–1.00)
Sodium: 144 mmol/L (ref 134–144)
eGFR: 95 mL/min/{1.73_m2} (ref >59)

## 2023-08-10 LAB — CBC WITH DIFFERENTIAL/PLATELET
Basophils Absolute: 0.1 10*3/uL (ref 0.0–0.2)
Basos: 0 %
EOS (ABSOLUTE): 0.1 10*3/uL (ref 0.0–0.4)
Eos: 1 %
Hematocrit: 44 % (ref 34.0–46.6)
Hemoglobin: 14.1 g/dL (ref 11.1–15.9)
Immature Grans (Abs): 0 10*3/uL (ref 0.0–0.1)
Immature Granulocytes: 0 %
Lymphocytes Absolute: 5.3 10*3/uL — ABNORMAL HIGH (ref 0.7–3.1)
Lymphs: 35 %
MCH: 28.4 pg (ref 26.6–33.0)
MCHC: 32 g/dL (ref 31.5–35.7)
MCV: 89 fL (ref 79–97)
Monocytes Absolute: 1.4 10*3/uL — ABNORMAL HIGH (ref 0.1–0.9)
Monocytes: 9 %
Neutrophils Absolute: 8.4 10*3/uL — ABNORMAL HIGH (ref 1.4–7.0)
Neutrophils: 55 %
Platelets: 364 10*3/uL (ref 150–450)
RBC: 4.96 x10E6/uL (ref 3.77–5.28)
RDW: 11.8 % (ref 11.7–15.4)
WBC: 15.3 10*3/uL — ABNORMAL HIGH (ref 3.4–10.8)

## 2023-08-10 LAB — EPSTEIN-BARR VIRUS (EBV) ANTIBODY PROFILE
EBV NA IgG: 216 U/mL — ABNORMAL HIGH (ref 0.0–17.9)
EBV VCA IgG: >600 U/mL — ABNORMAL HIGH (ref 0.0–17.9)
EBV VCA IgM: <36 U/mL (ref 0.0–35.9)

## 2023-08-10 LAB — FERRITIN: Ferritin: 14 ng/mL — ABNORMAL LOW (ref 15–150)

## 2023-08-11 ENCOUNTER — Other Ambulatory Visit: Payer: Self-pay | Admitting: Oncology

## 2023-08-11 ENCOUNTER — Encounter: Payer: Self-pay | Admitting: Physician Assistant

## 2023-08-11 ENCOUNTER — Encounter: Payer: Self-pay | Admitting: Oncology

## 2023-08-11 MED ORDER — AZITHROMYCIN 250 MG PO TABS: ORAL_TABLET | ORAL | 0 refills | Status: AC

## 2024-02-04 ENCOUNTER — Encounter: Payer: Self-pay | Admitting: Family Medicine

## 2024-02-04 ENCOUNTER — Ambulatory Visit (INDEPENDENT_AMBULATORY_CARE_PROVIDER_SITE_OTHER): Admitting: Family Medicine

## 2024-02-04 ENCOUNTER — Other Ambulatory Visit: Payer: Self-pay

## 2024-02-04 VITALS — BP 118/81 | HR 98 | Temp 97.2°F | Ht 67.0 in | Wt 147.0 lb

## 2024-02-04 DIAGNOSIS — R110 Nausea: Secondary | ICD-10-CM

## 2024-02-04 DIAGNOSIS — J3489 Other specified disorders of nose and nasal sinuses: Secondary | ICD-10-CM

## 2024-02-04 DIAGNOSIS — J069 Acute upper respiratory infection, unspecified: Secondary | ICD-10-CM

## 2024-02-04 DIAGNOSIS — R0981 Nasal congestion: Secondary | ICD-10-CM | POA: Diagnosis not present

## 2024-02-04 LAB — POC COVID19/FLU A&B COMBO
Covid Antigen, POC: NEGATIVE
Influenza A Antigen, POC: NEGATIVE
Influenza B Antigen, POC: NEGATIVE

## 2024-02-04 MED ORDER — ONDANSETRON 4 MG PO TBDP: 4.0000 mg | ORAL_TABLET | Freq: Three times a day (TID) | ORAL | 3 refills | Status: AC | PRN

## 2024-02-04 NOTE — Progress Notes (Signed)
 Assessment & Plan:  Patient ID: Kayla Good, female    DOB: January 02, 2001  Age: 23 y.o. MRN: [id number]  Problem List Items Addressed This Visit   None Visit Diagnoses       Viral URI    -  Primary     Nasal congestion       Relevant Orders   POC Covid19/Flu A&B Antigen     Sinus pressure       Relevant Orders   POC Covid19/Flu A&B Antigen     Nausea            Follow-up: Return if symptoms worsen or fail to improve. Symptomatic treatment will treatment. Continue to hydrate. Practice good hand hygiene and infection control.   Subjective:    CC: The primary encounter diagnosis was Viral URI. Diagnoses of Nasal congestion, Sinus pressure, and Nausea were also pertinent to this visit.  HPI Kayla Good presents for 2 1/2 days of feeling not well. Has been around sick contacts, has had HA, congestion, sinus congestion , nonproductive cough no myalgias, feels overall tired. She did have throat pain but does not have it now. Taking nyquil/dayquil. Hx of mono, does not feel like mono.   Would like a refill on her nausea medication.  History Kayla Good has no past medical history on file.   She has no past surgical history on file.   Her family history is not on file.She reports that she has never smoked. She has never used smokeless tobacco. She reports current alcohol use. She reports that she does not use drugs.  Outpatient Medications Prior to Visit  Medication Sig Dispense Refill   Drospirenone-Estetrol (NEXTSTELLIS) 3-14.2 MG TABS Take by mouth.     ondansetron  (ZOFRAN ) 4 MG tablet Take 1 tablet (4 mg total) by mouth every 8 (eight) hours as needed for nausea or vomiting. 5 tablet 0   hydrOXYzine  (ATARAX ) 10 MG tablet Take 1 tablet (10 mg total) by mouth 3 (three) times daily as needed. (Patient not taking: Reported on 02/04/2024) 60 tablet 0   NEXTSTELLIS 3-14.2 MG TABS Take 1 tablet by mouth daily.     ondansetron  (ZOFRAN -ODT) 4 MG disintegrating tablet Take 1 tablet (4 mg  total) by mouth every 8 (eight) hours as needed. (Patient not taking: Reported on 08/08/2023) 20 tablet 0   pantoprazole  (PROTONIX ) 20 MG tablet TAKE 1 TABLET BY MOUTH EVERY DAY (Patient not taking: Reported on 08/08/2023) 90 tablet 1   predniSONE  (STERAPRED UNI-PAK 21 TAB) 10 MG (21) TBPK tablet Take by mouth daily. Take 6 tabs by mouth daily  for 1 days, then 5 tabs for 1 days, then 4 tabs for 1 days, then 3 tabs for 1 days, 2 tabs for 1 days, then 1 tab by mouth daily for 1 days (Patient not taking: Reported on 08/08/2023) 21 tablet 0   No facility-administered medications prior to visit.    ROS per HPI   Objective:  BP 118/81   Pulse 98   Temp (!) 97.2 F (36.2 C) (Tympanic)   Ht 5' 7 (1.702 m)   Wt 147 lb (66.7 kg)   SpO2 97%   BMI 23.02 kg/m   Physical Exam GEN: A&O x 3, NAD, well nourished HEENT: Normocephalic, atraumatic, EOMI, OP patent, bilateral TMs normal, no LAD, no thyromegaly, no exudates, no erythema, she has sinus tenderness which is minimal  RESP: CTAB, no R/R/W CV: RRR, no M/R/G ABDOMEN: soft, nontender, normal BS, no HSM VAS: no Kayla Good swelling NEURO:  A&O x 3 MSK: normal gait, normal ROM UE and Kayla Good PSYCH: normal speech, normal mood SKIN: no rashes or lesions   Kayla Good Kayla Ariyanah Aguado, DO
